# Patient Record
Sex: Male | Born: 1935 | Race: Black or African American | Hispanic: No | Marital: Married | Smoking: Former smoker
Health system: Southern US, Community
[De-identification: ages and names within clinical notes are randomized; demographics above are authoritative.]

## PROBLEM LIST (undated history)

## (undated) ENCOUNTER — Emergency Department (HOSPITAL_COMMUNITY): Discharge status: Absent without leave | Payer: Self-pay

---

## 1999-06-08 ENCOUNTER — Encounter: Admission: RE | Admit: 1999-06-08 | Discharge: 1999-09-06 | Payer: Self-pay | Admitting: Rheumatology

## 2000-04-20 ENCOUNTER — Encounter: Admission: RE | Admit: 2000-04-20 | Discharge: 2000-04-20 | Payer: Self-pay | Admitting: Rheumatology

## 2000-04-20 ENCOUNTER — Encounter: Payer: Self-pay | Admitting: Rheumatology

## 2000-11-15 ENCOUNTER — Encounter: Admission: RE | Admit: 2000-11-15 | Discharge: 2001-01-08 | Payer: Self-pay | Admitting: Internal Medicine

## 2000-12-26 ENCOUNTER — Ambulatory Visit (HOSPITAL_BASED_OUTPATIENT_CLINIC_OR_DEPARTMENT_OTHER): Admission: RE | Admit: 2000-12-26 | Discharge: 2000-12-27 | Payer: Self-pay | Admitting: Orthopedic Surgery

## 2001-04-04 ENCOUNTER — Encounter: Admission: RE | Admit: 2001-04-04 | Discharge: 2001-04-09 | Payer: Self-pay | Admitting: Internal Medicine

## 2001-07-30 ENCOUNTER — Encounter: Admission: RE | Admit: 2001-07-30 | Discharge: 2001-08-06 | Payer: Self-pay | Admitting: Orthopedic Surgery

## 2001-11-06 ENCOUNTER — Encounter (HOSPITAL_BASED_OUTPATIENT_CLINIC_OR_DEPARTMENT_OTHER): Admission: RE | Admit: 2001-11-06 | Discharge: 2001-11-12 | Payer: Self-pay | Admitting: Orthopedic Surgery

## 2002-02-18 ENCOUNTER — Encounter (HOSPITAL_BASED_OUTPATIENT_CLINIC_OR_DEPARTMENT_OTHER): Admission: RE | Admit: 2002-02-18 | Discharge: 2002-02-21 | Payer: Self-pay | Admitting: Internal Medicine

## 2002-06-03 ENCOUNTER — Encounter (HOSPITAL_BASED_OUTPATIENT_CLINIC_OR_DEPARTMENT_OTHER): Admission: RE | Admit: 2002-06-03 | Discharge: 2002-09-02 | Payer: Self-pay | Admitting: Internal Medicine

## 2002-09-05 ENCOUNTER — Encounter (HOSPITAL_BASED_OUTPATIENT_CLINIC_OR_DEPARTMENT_OTHER): Admission: RE | Admit: 2002-09-05 | Discharge: 2002-12-04 | Payer: Self-pay | Admitting: Internal Medicine

## 2002-09-12 ENCOUNTER — Ambulatory Visit (HOSPITAL_COMMUNITY): Admission: RE | Admit: 2002-09-12 | Discharge: 2002-09-12 | Payer: Self-pay | Admitting: Gastroenterology

## 2002-09-12 ENCOUNTER — Encounter (INDEPENDENT_AMBULATORY_CARE_PROVIDER_SITE_OTHER): Payer: Self-pay | Admitting: Specialist

## 2003-03-12 ENCOUNTER — Encounter (HOSPITAL_BASED_OUTPATIENT_CLINIC_OR_DEPARTMENT_OTHER): Admission: RE | Admit: 2003-03-12 | Discharge: 2003-06-10 | Payer: Self-pay | Admitting: Internal Medicine

## 2003-07-03 ENCOUNTER — Encounter (HOSPITAL_BASED_OUTPATIENT_CLINIC_OR_DEPARTMENT_OTHER): Admission: RE | Admit: 2003-07-03 | Discharge: 2003-07-15 | Payer: Self-pay | Admitting: Internal Medicine

## 2003-10-09 ENCOUNTER — Encounter (HOSPITAL_BASED_OUTPATIENT_CLINIC_OR_DEPARTMENT_OTHER): Admission: RE | Admit: 2003-10-09 | Discharge: 2003-10-16 | Payer: Self-pay | Admitting: Internal Medicine

## 2003-11-26 ENCOUNTER — Encounter: Admission: RE | Admit: 2003-11-26 | Discharge: 2003-11-26 | Payer: Self-pay | Admitting: Rheumatology

## 2004-01-21 ENCOUNTER — Encounter (HOSPITAL_BASED_OUTPATIENT_CLINIC_OR_DEPARTMENT_OTHER): Admission: RE | Admit: 2004-01-21 | Discharge: 2004-01-30 | Payer: Self-pay | Admitting: Internal Medicine

## 2004-04-10 ENCOUNTER — Encounter: Admission: RE | Admit: 2004-04-10 | Discharge: 2004-04-10 | Payer: Self-pay | Admitting: Rheumatology

## 2004-05-05 ENCOUNTER — Encounter (HOSPITAL_BASED_OUTPATIENT_CLINIC_OR_DEPARTMENT_OTHER): Admission: RE | Admit: 2004-05-05 | Discharge: 2004-05-18 | Payer: Self-pay | Admitting: Internal Medicine

## 2004-08-10 ENCOUNTER — Encounter (HOSPITAL_BASED_OUTPATIENT_CLINIC_OR_DEPARTMENT_OTHER): Admission: RE | Admit: 2004-08-10 | Discharge: 2004-08-30 | Payer: Self-pay | Admitting: Internal Medicine

## 2005-10-10 ENCOUNTER — Inpatient Hospital Stay (HOSPITAL_COMMUNITY): Admission: EM | Admit: 2005-10-10 | Discharge: 2005-10-12 | Payer: Self-pay | Admitting: Emergency Medicine

## 2005-10-11 ENCOUNTER — Encounter (INDEPENDENT_AMBULATORY_CARE_PROVIDER_SITE_OTHER): Payer: Self-pay | Admitting: *Deleted

## 2008-06-09 ENCOUNTER — Encounter: Admission: RE | Admit: 2008-06-09 | Discharge: 2008-06-09 | Payer: Self-pay | Admitting: Rheumatology

## 2008-07-14 ENCOUNTER — Encounter: Admission: RE | Admit: 2008-07-14 | Discharge: 2008-07-14 | Payer: Self-pay | Admitting: Orthopedic Surgery

## 2008-07-15 ENCOUNTER — Encounter (INDEPENDENT_AMBULATORY_CARE_PROVIDER_SITE_OTHER): Payer: Self-pay | Admitting: Orthopedic Surgery

## 2008-07-15 ENCOUNTER — Ambulatory Visit (HOSPITAL_BASED_OUTPATIENT_CLINIC_OR_DEPARTMENT_OTHER): Admission: RE | Admit: 2008-07-15 | Discharge: 2008-07-15 | Payer: Self-pay | Admitting: Orthopedic Surgery

## 2010-05-21 ENCOUNTER — Encounter: Admission: RE | Admit: 2010-05-21 | Discharge: 2010-05-21 | Payer: Self-pay | Admitting: Internal Medicine

## 2010-05-21 ENCOUNTER — Inpatient Hospital Stay (HOSPITAL_COMMUNITY): Admission: EM | Admit: 2010-05-21 | Discharge: 2010-05-27 | Payer: Self-pay | Admitting: Internal Medicine

## 2010-10-05 LAB — GLUCOSE, CAPILLARY
Glucose-Capillary: 109 mg/dL — ABNORMAL HIGH (ref 70–99)
Glucose-Capillary: 146 mg/dL — ABNORMAL HIGH (ref 70–99)
Glucose-Capillary: 149 mg/dL — ABNORMAL HIGH (ref 70–99)
Glucose-Capillary: 170 mg/dL — ABNORMAL HIGH (ref 70–99)
Glucose-Capillary: 171 mg/dL — ABNORMAL HIGH (ref 70–99)
Glucose-Capillary: 171 mg/dL — ABNORMAL HIGH (ref 70–99)
Glucose-Capillary: 182 mg/dL — ABNORMAL HIGH (ref 70–99)
Glucose-Capillary: 196 mg/dL — ABNORMAL HIGH (ref 70–99)

## 2010-10-06 LAB — BASIC METABOLIC PANEL
BUN: 7 mg/dL (ref 6–23)
BUN: 7 mg/dL (ref 6–23)
CO2: 29 mEq/L (ref 19–32)
CO2: 31 mEq/L (ref 19–32)
Chloride: 97 mEq/L (ref 96–112)
Creatinine, Ser: 0.96 mg/dL (ref 0.4–1.5)
GFR calc non Af Amer: >60 mL/min (ref >60)
GFR calc non Af Amer: >60 mL/min (ref >60)
Glucose, Bld: 176 mg/dL — ABNORMAL HIGH (ref 70–99)
Glucose, Bld: 253 mg/dL — ABNORMAL HIGH (ref 70–99)
Glucose, Bld: 263 mg/dL — ABNORMAL HIGH (ref 70–99)
Potassium: 3.9 mEq/L (ref 3.5–5.1)
Potassium: 4 mEq/L (ref 3.5–5.1)
Sodium: 133 mEq/L — ABNORMAL LOW (ref 135–145)
Sodium: 140 mEq/L (ref 135–145)

## 2010-10-06 LAB — GLUCOSE, CAPILLARY
Glucose-Capillary: 122 mg/dL — ABNORMAL HIGH (ref 70–99)
Glucose-Capillary: 185 mg/dL — ABNORMAL HIGH (ref 70–99)
Glucose-Capillary: 197 mg/dL — ABNORMAL HIGH (ref 70–99)
Glucose-Capillary: 202 mg/dL — ABNORMAL HIGH (ref 70–99)
Glucose-Capillary: 207 mg/dL — ABNORMAL HIGH (ref 70–99)
Glucose-Capillary: 218 mg/dL — ABNORMAL HIGH (ref 70–99)
Glucose-Capillary: 231 mg/dL — ABNORMAL HIGH (ref 70–99)
Glucose-Capillary: 289 mg/dL — ABNORMAL HIGH (ref 70–99)

## 2010-10-06 LAB — COMPREHENSIVE METABOLIC PANEL
ALT: 19 U/L (ref 0–53)
AST: 18 U/L (ref 0–37)
Alkaline Phosphatase: 83 U/L (ref 39–117)
CO2: 31 mEq/L (ref 19–32)
GFR calc Af Amer: >60 mL/min (ref >60)
Glucose, Bld: 195 mg/dL — ABNORMAL HIGH (ref 70–99)
Potassium: 4.4 mEq/L (ref 3.5–5.1)
Sodium: 135 mEq/L (ref 135–145)
Total Protein: 8.1 g/dL (ref 6.0–8.3)

## 2010-10-06 LAB — CBC
HCT: 44.3 % (ref 39.0–52.0)
Hemoglobin: 14.5 g/dL (ref 13.0–17.0)
MCHC: 31.6 g/dL (ref 30.0–36.0)
MCV: 80.6 fL (ref 78.0–100.0)
Platelets: 279 10*3/uL (ref 150–400)
RDW: 15.1 % (ref 11.5–15.5)
RDW: 15.3 % (ref 11.5–15.5)
WBC: 12 10*3/uL — ABNORMAL HIGH (ref 4.0–10.5)
WBC: 9.1 10*3/uL (ref 4.0–10.5)

## 2010-10-06 LAB — PHOSPHORUS: Phosphorus: 1.9 mg/dL — ABNORMAL LOW (ref 2.3–4.6)

## 2010-12-07 NOTE — Op Note (Signed)
NAMEENRRIQUE, MIERZWA               ACCOUNT NO.:  [id number]   MEDICAL RECORD NO.:  [id number]          PATIENT TYPE:  AMB   LOCATION:  DSC                          FACILITY:  [facility]   PHYSICIAN:  Katy Fitch. Sypher, M.D. 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   OPERATION:  1. Examination of right shoulder under anesthesia documenting 60      degrees flexion contracture of the right elbow and basic stability      to radial and ulnar testing at 60 degrees flexion and 90 degrees      flexion.  2. Decompression of right ulnar nerve and exploration of ulnar      collateral ligament structures of medial aspect of right elbow.  3. Anterior, lateral, and anterior-posterior synovectomy of right      elbow with extensive capsulectomy and decompression of posterior      interosseous nerve via synovectomy with decision to not to resect      radial head out of concern that it could lead to valgus instability      of elbow.  4. Olecranon osteoplasty with removal of superior olecranon osteophyte      and marginal osteophytes.   OPERATING SURGEON:  Katy Fitch. Sypher, MD   ASSISTANT:  None.   ANESTHESIA:  General by LMA.   SUPERVISING ANESTHESIOLOGIST:  Quita Skye. Krista Blue, MD   INDICATIONS:  Baylor Cortez is a 77 year old  gentleman well acquainted  with our practice.  He was referred by Dr. Lennox Pippins for evaluation  and management of complex arthritis deformities and synovitis issues.  Mr. Birdwell is intolerant to nonsteroidal medications and has been  maintained on prednisone.   Recently, he was referred by Dr. Coral Spikes due to profound swelling of his  right elbow pain and loss of motion.  Examination of the right elbow  revealed that he had profound synovitis and possible ulnar and posterior  interosseous nerve compression.  It was difficult to judge if his distal  weakness was due to his advanced arthritis or nerve compression.   He had screening electrodiagnostic studies by Dr. Johna Roles, which  revealed some slowing of the ulnar nerve conduction, but no signs of  frank impairment.  His posterior interosseous motors appeared to be  functional.   Plain x-rays of the elbow demonstrated erosive arthropathy due to poorly-  controlled rheumatoid arthritis.  He had bone-on-bone arthropathy at the  radiocapitellar articulation and a very large proximal olecranon  osteophyte that may have been impinging and causing a portion  of his  failure to extend the elbow.   We advised Mr. Kau to undergo synovectomy of the elbow anticipating  possible radial head resection, pre-synovectomy, decompression of the  ulnar nerve for safe retraction of the nerve and decompression to  relieve his ulnar dysesthesias and anticipation of aggressive  synovectomy with possible olecranon osteophyte resection to facilitate  recovery of elbow extension.   After informed consent, Mr. Harte was brought to the operating room at  this time.  Preoperatively, he was interviewed by Dr. Krista Blue and advised  to undergo general anesthesia by endotracheal technique.   PROCEDURE:  Yadiel Aubry. Lybarger was brought to the operating room and  placed in supine position upon the operating table.   Following the induction of general anesthesia  by LMA technique,  supplemental Decadron was provided for steroid support.  Ancef 1 g was  administered as an IV prophylactic antibiotic beginning in the holding  area followed by routine Betadine scrub and paint of the right upper  extremity.  A pneumatic tourniquet was applied to the proximal right  brachium.   Following exsanguination of the right arm with an Esmarch bandage, an  arterial tourniquet was inflated to 240 mmHg and later elevated to 270  mmHg due to systolic hypertension.   The procedure commenced with a curvilinear incision paralleling the path  of the ulnar nerve.  Subcutaneous tissues were carefully divided taking  care to identify the ulnar nerve at the proximal aspect of the medial  epicondyle.  A decompression of the ulnar nerve in situ was accomplished  followed by placement of a quarter-inch Penrose drain for medial and  superior traction.  There was noted to be some synovitis along the  medial gutter of the elbow; however, the ulnar collateral ligament  structures were intact.   Attention was then directed to the lateral aspect of the elbow were a  lengthy incision paralleling the crest of the lateral epicondyle was  continued distally over the radial head and neck.  Care was taken to  identify and protect the lateral ulnar collateral ligament followed by  elevation of the extensor carpi radialis brevis and longus muscle  origins, and release of the brachial radialis and anterior superior  capsule.  The joint was entered and blunt retractors placed to expose  the radial neck and the anterior capsule.  There were profound  loculations within the elbow joint and approximately 150 mL of hyperemic  synovitis.  A meticulous synovectomy was accomplished of the proximal  radioulnar joint, the elbow joint including the humeral ulnar and radial  capitellar joints followed by release of some of the capsular loculation  and stripping of the anterior capsule off of the  distal humerus in an  effort to relieve the flexion contracture.  Care was taken not to  violate the brachial radialis muscle nor the capsule adjacent to the  radial neck for fear of causing injury to the posterior interosseous  nerve branches.  After complete synovectomy, hemostasis was achieved  with bipolar cautery under saline and suctioned.  We then created a  posterior arthrotomy preserving the lateral ulnar collateral ligament  and radial collateral ligaments, and performed a meticulous synovectomy  of the posterior humeral ulnar articulation.  There was a large proximal  osteophyte on the olecranon that was resected with removal of more than  12 mm of bone.  The osteophytes along the margin of the humeral ulnar  articulation were removed with rongeurs and osteotome dissection in an  effort to try to improve extension.  Ultimately, extension of the elbow  to about 20 degrees was achieved under anesthesia.   After meticulous hemostasis, we carefully examined the position of the  ulnar nerve and performed a medial posterior synovectomy with the nerve  retracted out of the cubital groove.   After hemostasis was achieved, the wound was then repaired with  reconstruction of the extensor origin of the extensor carpi radialis  longus and brevis with multiple mattress sutures of 3-0 FiberWire  followed by repair of the capsule and fascia with figure-of-eight  sutures of 3-0 Ethibond.  The skin was repaired with subcutaneous  sutures of 4-0 Vicryl and intradermal 3-0 Prolene, repairing both the  medial and lateral wounds.   A 7-French Hemovac drain was placed in the anterior joint to prevent  collection of a postoperative hematoma and tense effusion.  The wound  was infiltrated with 2% lidocaine for postoperative analgesia prior to  placing the drain at suction.  There were no apparent complications.  Estimated blood loss was less than 50 mL.  Approximately, 150 mL of  synovium was  harvested and representative specimens sent to pathology  for examination.   The tourniquet was released with total tourniquet time of 1 hour 43  minutes.  Mr. Stavely was awakened from anesthesia, placed in sling and  transferred to recovery room in stable vital signs in a soft dressing.  He will return to our office for followup in 24 hours for drain removal.      Katy Fitch. Sypher, M.D.  Electronically Signed     RVS/MEDQ  D:  07/15/2008  T:  07/16/2008  Job:  [id number]   cc:   Demetria Pore. Coral Spikes, M.D.

## 2010-12-10 NOTE — Consult Note (Signed)
NAMEKENSON, Walter Benson NO.:  [id number]   MEDICAL RECORD NO.:  [id number]          PATIENT TYPE:  OBV   LOCATION:  5524                         FACILITY:  [facility]   PHYSICIAN:  Petra Kuba, M.D.    DATE OF BIRTH:  1933/01/14   DATE OF CONSULTATION:  10/11/2005  DATE OF DISCHARGE:                                   CONSULTATION   REFERRING PHYSICIAN:  Dr. Theressa Millard.   HISTORY:  The patient is seen at the request of Dr. Benjaman Kindler for melena,  anemia and guaiac-positive.  He has been on an aspirin a day and does have a  history of ulcers in the '80s, but no other GI problem.  He supposedly had  an uneventful colonoscopy by Dr. Laural Benes in 2004.   PAST MEDICAL HISTORY:  1.  Diabetes.  2.  Arthritis.   PAST SURGICAL HISTORY:  1.  Cholecystectomy.  2.  Hand surgery.   ALLERGIES:  None, although he lists ASPIRIN.   MEDICINES AT HOME:  Medicines at home include prednisone and methotrexate,  had been on baby aspirin, Metformin, Centrum Silver, Fosamax and Cytotec.   FAMILY HISTORY:  Family history is negative for any obvious GI problems.   SOCIAL HISTORY:  He does drink socially, does smoke.   REVIEW OF SYSTEMS:  Negative, except above.  He is feeling better.   PHYSICAL EXAMINATION:  VITALS:  See chart.  LUNGS:  Clear.  CARDIAC:  Regular rate and rhythm.  ABDOMEN:  Soft and nontender.   LABORATORY DATA:  No repeat labs done since yesterday.  See chart for  details.   ASSESSMENT:  1.  Major medical problems.  2.  Melena in a patient with history of ulcers years ago, on a baby aspirin      a day.   PLAN:  The risks, benefits and methods of endoscopy were discussed; we  compared it to the colonoscopy he has had.  We will proceed this morning  with further workup and plans pending those findings.           ______________________________  Petra Kuba, M.D.     MEM/MEDQ  D:  10/11/2005  T:  10/12/2005  Job:  [id number]   cc:   Theressa Millard, M.D.  Fax: [phone]

## 2010-12-10 NOTE — Discharge Summary (Signed)
NAMETAYRON, Walter Benson NO.:  [id number]   MEDICAL RECORD NO.:  [id number]          PATIENT TYPE:  INP   LOCATION:  5524                         FACILITY:  [facility]   PHYSICIAN:  Theressa Millard, M.D.    DATE OF BIRTH:  03/27/1933   DATE OF ADMISSION:  10/10/2005  DATE OF DISCHARGE:  10/12/2005                                 DISCHARGE SUMMARY   ADMISSION DIAGNOSIS:  Gastrointestinal bleed.   DISCHARGE DIAGNOSES:  1.  Gastrointestinal bleed secondary to antral erosions secondary to      Helicobacter pylori.  2.  Acute blood loss anemia.  3.  Diabetes mellitus.  4.  Rheumatoid arthritis.   The patient is a 77 year old black male who was admitted with two melenic  stools.  At admission his hemoglobin was 8.6.   HOSPITAL COURSE:  The patient was admitted and he was transfused two units  of packed cells.  Subsequent hemoglobin came back 8.1 and after that 7.8.  He had no further melena in the hospital.  He was seen in consultation by  Dr. Ewing Schlein and underwent endoscopy with findings of antral erosions.  CLOtest  was done and is positive.   He did well after the procedure, and he is discharged in improved condition.   DISCHARGE MEDICATIONS:  1.  Clarithromycin 500 mg b.i.d. and amoxicillin 1 g b.i.d. x14 days, with      Prevacid SoluTab 30 mg daily.  2.  Glucotrol XL 10 mg daily.  3.  Methotrexate 10 mg weekly.  4.  Prednisone 5 mg daily.  5.  Folic acid 1 mg daily.  6.  Metformin 500 mg b.i.d.   FOLLOW-UP:  He has an appointment to see Dr. Coral Spikes in one week.   DISCHARGE ACTIVITY:  As tolerated.   DISCHARGE DIET:  Modified carbohydrate diabetic diet.      Theressa Millard, M.D.  Electronically Signed     JO/MEDQ  D:  10/12/2005  T:  10/13/2005  Job:  [id number]   cc:   Petra Kuba, M.D.  Fax: [phone]

## 2010-12-10 NOTE — H&P (Signed)
NAMEJOHNATTAN, STRASSMAN NO.:  [id number]   MEDICAL RECORD NO.:  [id number]          PATIENT TYPE:  OBV   LOCATION:  5524                         FACILITY:  [facility]   PHYSICIAN:  Theressa Millard, M.D.    DATE OF BIRTH:  05/02/1933   DATE OF ADMISSION:  10/10/2005  DATE OF DISCHARGE:                                HISTORY & PHYSICAL   Walter Benson is a 77 year old black male who was admitted with GI bleed.   He has a history of peptic ulcer disease in the 1980s.  It healed with  medications and was thought to be due to taking Indocin.  He felt some  stomach gurgling without any discomfort on the evening of March 17.  He took  some Mylanta.  On the morning of March 18 he had black stools on two  occasions that were semi-formed and thick.  They were tar-like.  He has had  no bowel movements today and no further black stools.  The gurgling has gone  away.  He took his usual medications today, but has not taken his Fosamax.  He feels a little bit weak, but has had no dizziness.  He had a colonoscopy  in 2004.  He occasionally consumes alcohol.  He does not take Aleve or  ibuprofen but he does take enteric-coated aspirin.   PAST MEDICAL HISTORY:   MEDICATIONS:  1.  Glucotrol XL 10 mg daily.  2.  Methotrexate 10 mg weekly.  3.  Prednisone 5 mg daily.  4.  Folic acid 1 mg daily.  5.  Ecotrin 81 mg daily.  6.  Multivitamin daily.  7.  Fosamax weekly.   MEDICAL:  1.  Seropositive erosive nodular rheumatoid arthritis.  He has extra-      articular manifestations including episcleritis.  2.  Mild anemia attributed to rheumatoid arthritis.  3.  Diabetes mellitus.  No complications of diabetes known.  4.  Hematuria in 1985 with negative workup.  5.  Lactose intolerance.   SURGICAL:  1.  Cholecystectomy.  2.  Tonsillectomy.  3.  Circumcision.  4.  Nasal passage surgery.   SOCIAL HISTORY:  Married.  Stopped smoking over 20 years ago.  Retired from  the school system.   FAMILY HISTORY:  Diabetes, cancer of unknown type, heart disease after age  84.   REVIEW OF SYSTEMS:  Otherwise negative.   PHYSICAL EXAMINATION:  GENERAL:  Well-developed, well-nourished, in no  distress.  VITAL SIGNS:  Blood pressure 118/70, pulse 120 and regular sitting.  HEENT:  The eyes have pupils which are round, react to light.  The  extraocular movements are intact.  Funduscopic examination is normal.  Ears  are normal.  Nose and throat unremarkable.  NECK:  Supple.  Thyroid is not enlarged or tender.  CHEST:  Clear to auscultation, percussion.  CARDIAC:  Normal S1 and S2 without an S3, S4, murmur, rub, or click.  ABDOMEN:  Soft and nontender with normal bowel sounds.  Stool is black and  heme-positive.   LABORATORY DATA:  Pending.   IMPRESSION:  Gastrointestinal bleeding.  He has  some bleeding and his pulse  is up somewhat.  I will bring him in for observation and possible EGD  tonight or tomorrow.  Will check on laboratory work to decide on that.  Start a proton-pump inhibitor.  Hold Ecotrin and Fosamax for now.      Theressa Millard, M.D.  Electronically Signed     JO/MEDQ  D:  10/10/2005  T:  10/11/2005  Job:  [id number]

## 2010-12-10 NOTE — Op Note (Signed)
   NAME:  Walter Benson, Walter Benson                         ACCOUNT NO.:  [id number]   MEDICAL RECORD NO.:  [id number]                   PATIENT TYPE:  AMB   LOCATION:  ENDO                                 FACILITY:  [facility]   PHYSICIAN:  Danise Edge, M.D.                DATE OF BIRTH:  Mar 17, 1933   DATE OF PROCEDURE:  09/12/2002  DATE OF DISCHARGE:                                 OPERATIVE REPORT   PROCEDURE PERFORMED:  Colonoscopy and sigmoid polypectomy.   REFERRING PHYSICIAN:  Demetria Pore. Coral Spikes, M.D.   ENDOSCOPIST:  Charolett Bumpers, M.D.   INDICATIONS FOR PROCEDURE:  The patient is a 77 year old male born November 03, 1932.  Walter Benson is scheduled to undergo his first screening colonoscopy  with polypectomy to prevent colon cancer.   PREMEDICATION:  Demerol 50 mg, Versed 5 mg.   INSTRUMENT USED:  Adult Olympus colonoscope.   DESCRIPTION OF PROCEDURE:  After obtaining informed consent, the patient was  placed in the left lateral decubitus position.  I administered intravenous  Demerol and intravenous Versed to achieve conscious sedation for the  procedure.  The patient's blood pressure, oxygen saturations and cardiac  rhythm were monitored throughout the procedure and documented in the medical  record.   Anal inspection was normal.  Digital rectal exam was normal.  The Olympus  colonoscope was introduced into the rectum and advanced to the cecum.  Colonic preparation for the exam today was satisfactory.   Rectum:  Normal.   Sigmoid colon and descending colon:  Left colonic diverticulosis.  At 35 cm  from the anal verge, a 2 mm sessile polyp was removed with the  electrocautery snare.   Splenic flexure:  Normal.   Transverse colon:  Normal.   Hepatic flexure:  Normal.   Ascending colon:  Normal.   Cecum and ileocecal valve:  Normal.    ASSESSMENT:  1. From the sigmoid colon at 35 cm from the anal verge, a 2 mm sessile polyp     was removed.  2. Left colonic  diverticulosis.                                                 Danise Edge, M.D.    MJ/MEDQ  D:  09/12/2002  T:  09/12/2002  Job:  [id number]   cc:   Demetria Pore. Coral Spikes, M.D.  [address]  [address]  [place]  [place] [address]  Fax: [phone]

## 2010-12-10 NOTE — Op Note (Signed)
[place]  Patient:    Walter Benson, Walter Benson                      MRN: [id number] Proc. Date: 12/26/00 Adm. Date:  [id number] Attending:  Sypher, Douglass Rivers CC:         Demetria Pore. Coral Spikes, M.D.   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  OPERATION PERFORMED: 1. Arthrodesis of left wrist with autogenous left ulnar and radial bone graft    with ASIF 7-hole dorsal plate. 2. Distal ulnar resection left wrist with synovectomy of the distal radioulnar    joint and reconstruction of capsule. 3. Incidental synovectomy of wrist and carpometacarpal joints of index and    long finger, left hand.  SURGEON:  Katy Fitch. Sypher, Montez Hageman., M.D.  ASSISTANT:  Jonni Sanger, P.A.  ANESTHESIA:  General orotracheal.  SUPERVISING ANESTHESIOLOGIST:  Dr. Gelene Mink.  INDICATIONS FOR PROCEDURE:  The patient is a 77 year old gentleman who has had chronic rheumatoid arthritis.  He was followed by Dr. Lennox Pippins from a rheumatology management standpoint.  Dr. Coral Spikes and Dr. Thurston Hole referred him for an upper extremity orthopedic consult due to chronic left wrist pain.  I had seen him approximately four years prior and had advised that he consider wrist arthrodesis when his pain became intolerable.  Recent x-rays revealed bone-on-bone arthropathy at the radiocarpal articulation and signs of chronic ulnocarpal abutment and severe arthritis of the distal radioulnar joint.  After informed consent, he was brought to the operating room at this time anticipating arthrodesis of the left wrist with possible iliac bone graft versus distal ulnar and  radial bone graft with anticipated resection arthroplasty of the distal radioulnar joint.  DESCRIPTION OF PROCEDURE:  Raiquan Chandler was brought to the operating room and placed in supine position on the operating table.  Following induction of general orotracheal anesthesia, the left arm was prepped with Betadine soap and solution and sterilely draped.  The left anterior iliac crest region was prepped with DuraPrep and draped with impervious arthroscopy drapes.  1 gm of Ancef was administered as an IV prophylactic antibiotic and 100 mg of Solu-Cortef was administered for steroid support in the perioperative  period.  Following exsanguination of the limb with an Esmarch bandage, the arterial tourniquet was inflated to 220 mmHg and later elevated to 270 mmHg due to labile hypertension.  The procedure commenced with a 15 cm longitudinal incision extending from the diaphysis of the long finger metacarpal to the distal radial metaphysis.  The subcutaneous tissues were carefully divided taking care to electrocauterize the perforating veins.  The third dorsal compartment was entered and the extensor pollicis longus retracted in a radial direction.  The periosteum overlying the dorsal radius was elevated over the second, third and fourth dorsal compartments.  The wrist capsule was incised longitudinally and a complete synovectomy of the radiocarpal articular, ulnocarpal articulation, midcarpal articulation and the carpometacarpal joints was accomplished.  The remaining portions of hyaline articular cartilage on the distal radius, scaphoid, lunate and triquetrum were removed with curet and osteotome dissection.  A rongeur was used to clear debris.  The distal radius was then feathered with an osteotome and the contour of the dorsal distal  radius was altered by removal of Listers tubercle as a bone graft with a rongeur followed by use of an osteotome to take corticocancellous shavings to a  total volume of approximately 15 cc.  This leveled the contour of the distal radius and allowed application of a semitubular ASIF 7-hole plate.  The plate was contoured to accommodate position of wrist dorsiflexion of 20 degrees and slight ulnar deviation.  The soft tissues overlying the long finger metacarpal were elevated and the base of the metacarpal was reshaped with an osteotome also harvesting autogenous graft.  The plate was contoured and a second 4-hole plate was used to stack to give maximum strength across the carpus.  The distal ulna was exposed to a transverse incision in the extensor retinaculum directly between the fifth and sixth dorsal compartments.  The capsule of the distal radioulnar joint was entered with electrocautery followed by use of Therapist, nutritional and a Kleinert elevator to expose the distal ulna protecting the triangular fibrocartilage and capsule.  The distal ulna was resected with oscillating saw followed by use rongeur to trim the margins of the distal ulna and complete a synovectomy of the distal radioulnar joint.  The triangular fibrocartilage was significantly degenerative with a large central perforation.  The dorsal and palmar radioulnar ligaments were preserved.  The capsule was imbricated with mattress sutures of 3-0 Ethibond and the retinaculum repaired with 3-0 Ethibond.  The wrist joint was then filled with corticocancellous shavings from the distal radius, third metacarpal base and the ulnar head.  They were cleared of all soft tissues and hyaline cartilage.  The plate was then applied with the usual ASIF technique, applying compression by eccentric placement of screws proximally and distally.  A very satisfactory position of the wrist was achieved in approximately 20 degrees dorsiflexion and slight ulnar deviation.  The C-arm fluoroscopy was used to confirm excellent plate contour and satisfactory placement of cortical and  cancellous screws.  The wound was then thoroughly lavaged with sterile saline followed by repair  of the capsule with mattress sutures of 3-0 Ethibond and repair of the retinaculum with 3-0 Ethibond.  The skin was repaired with subdermal sutures of 3-0 Vicryl and intradermal 3-0 Prolene with Steri-Strips and vessel loop drains to prevent collection of postoperative hematoma.  Mr. Coury tolerated the surgery and anesthesia well and was transferred to the recovery room with stable vital signs.  Note that he will be observed for 24 hours in the [facility] with observation of his vital signs and analgesics in the form of PCA morphine and IV Dilaudid as well as Ancef 1 gm IV q.8h. as a prophylactic antibiotic. DD:  12/26/00 TD:  12/26/00 Job: [id number]

## 2010-12-10 NOTE — Consult Note (Signed)
[facility]  Patient:    Walter Benson, Walter Benson                     MRN: [id number] Proc. Date: 11/15/00 Attending:  Jonelle Sports. Cheryll Cockayne, M.D. CC:         Elana Alm. Thurston Hole, M.D.   Consultation Report  HISTORY:  This 77 year old black male with known rheumatoid arthritis is referred for consideration of appropriate footwear and inserts.  He apparently has had some progressive deformity in recent years related to his rheumatoid arthritis with overlapping of the toes, particularly on the left foot, and with some hallux valgus formations bilaterally and general fibular drift to the forefoot.  He reports that he had a great deal of pain in his left foot, particularly in the midfoot, and has been wearing a self-prescribed soft orthotic ankle appliance there.  He apparently has never had ulceration of his feet and as far as he knows, his circulation is intact.  PAST MEDICAL HISTORY:  As previously mentioned, he has rheumatoid arthritis, under the treatment of Dr. Demetria Pore. Levitin.  He has type 2 diabetes which has been present for several years.  MEDICATIONS:  He takes both methotrexate and prednisone along with Glucotrol XL and metformin for his diabetes.  He is on a baby aspirin per day.  PAST SURGICAL HISTORY:  His only prior surgeries have been cholecystectomy and nasal surgery.  ALLERGIES:  He has no known medicinal allergies.  PHYSICAL EXAMINATION:  EXTREMITIES:  Examination today is limited to the distal lower extremities. The patients feet are warm and well-perfused.  Skin temperatures are essentially normal and symmetrical throughout.  There are minimal callus formations on the dorsal interphalangeal joints of the fourth and fifth toes on the right.  There is no significant edema.  All pulses are palpable and quite adequate.  Monofilament testing shows the presence of protective sensation throughout both feet.  As previously indicated, there  are deformities with considerable hallux valgus formations bilaterally and with overlapping of the second and third toes on the left foot.  There is some metatarsalgia in that foot to palpation.  DISPOSITION: 1. The patient is given instruction regarding foot care by video presentation    with nurse reinforcement. 2. This clinic concurs in the patients need for proper footwear and he is    accordingly seen in consultation with Mr. Alison Stalling of Probation officer.    Plans will be made to provide the patient with shoes of extra depth and    width, fitted with adequate soft custom-molded inserts in hopes of    minimizing his discomfort and heading off future problems. 3. Return visit to this clinic will be in six weeks after he has obtained his    shoes. DD:  11/15/00 TD:  11/16/00 Job: [id number]

## 2010-12-10 NOTE — Op Note (Signed)
NAMEELDREDGE, Walter Benson NO.:  [id number]   MEDICAL RECORD NO.:  [id number]          PATIENT TYPE:  OBV   LOCATION:  5524                         FACILITY:  [facility]   PHYSICIAN:  Petra Kuba, M.D.    DATE OF BIRTH:  03-17-1933   DATE OF PROCEDURE:  10/11/2005  DATE OF DISCHARGE:                                 OPERATIVE REPORT   PROCEDURE PERFORMED:  Esophagogastroduodenoscopy with biopsy.   ENDOSCOPIST:  Petra Kuba, M.D.   INDICATIONS FOR PROCEDURE:  Melena, anemia, patient on baby aspirin.  History of ulcers in past.  Consent was signed after the risks, benefits,  methods and options were thoroughly discussed prior to any premeds given.   MEDICINES USED:  Demerol 40 mg, Versed 5 mg.   DESCRIPTION OF PROCEDURE:  The video endoscope was inserted by direct  vision.  He did have a tiny hiatal hernia.  Scope passed into the stomach  and some flecks of old blood were seen but no signs of active bleeding.  The  scope was advanced through a normal antrum, normal pylorus into the duodenal  bulb where two tiny ulcers or superficial erosions were seen.  There was no  at-risk lesions.  The area was washed, could not be made to bleed.  The  scope was advanced around the C-loop to a normal second portion of the  duodenum.  No blood was seen distally.  The scope was withdrawn back to the  bulb and a good look there confirmed the above findings.  Again, these areas  were washed and watched and could not be made to bleed.  Scope was withdrawn  back to the stomach where some of the flecks of blood were washed and  suctioned.  On straight and retroflex visualization, including a good look  at the cardia, fundus and angularis, lesser and greater curve.  No  abnormalities were seen.  Two biopsies of the antrum for the CLO test to  rule out Helicobacter pylori and then two of the antrum and two of the  proximal stomach were obtained and put in pathology to rule out  Helicobacter. Air was suctioned as was fluid suctioned, scope was slowly  withdrawn.  Again, a good look at the esophagus was normal except for the  tiny hiatal hernia.  Scope was removed.  The patient tolerated the procedure  well.  There were no obvious immediate complication.   ENDOSCOPIC DIAGNOSES:  1.  Tiny hiatal hernia.  2.  Minimal flecks of old blood in the stomach.  3.  Two superficial tiny ridges/ulcers in the bulb without any at risk      stigmata.  4.  Otherwise normal esophagogastroduodenoscopy to second portion of the      duodenum, status post CLO biopsy and biopsy of the stomach to rule out      Helicobacter pylori.   PLAN:  Hold aspirin.  Pump inhibitors for two months. If he has to go back  to aspirin or nonsteroidals, use pump inhibitors as prevention.  Await  pathology.  If positive treat Helicobacter.  Happy to  see back p.r.n.  Okay  to advance diet and probably go home soon if no signs of further bleeding or  delayed complications.           ______________________________  Petra Kuba, M.D.     MEM/MEDQ  D:  10/11/2005  T:  10/12/2005  Job:  [id number]   cc:   Demetria Pore. Coral Spikes, M.D.  Fax: [phone]   Theressa Millard, M.D.  Fax: [phone]

## 2011-04-28 LAB — BASIC METABOLIC PANEL
BUN: 10 mg/dL (ref 6–23)
Chloride: 105 mEq/L (ref 96–112)
GFR calc Af Amer: >60 mL/min (ref >60)
GFR calc non Af Amer: >60 mL/min (ref >60)
Potassium: 4.1 mEq/L (ref 3.5–5.1)
Sodium: 139 mEq/L (ref 135–145)

## 2011-04-28 LAB — POCT HEMOGLOBIN-HEMACUE: Hemoglobin: 11.1 g/dL — ABNORMAL LOW (ref 13.0–17.0)

## 2011-04-28 LAB — GLUCOSE, CAPILLARY
Glucose-Capillary: 138 mg/dL — ABNORMAL HIGH (ref 70–99)
Glucose-Capillary: 181 mg/dL — ABNORMAL HIGH (ref 70–99)

## 2011-09-24 IMAGING — CR DG ABDOMEN 2V
2 series · 2 of 2 positions shown · non-contrast
Comparison: 05/24/2010

CLINICAL DATA: Small bowel obstruction

ABDOMEN - 2 VIEW

[w abdomen upright]
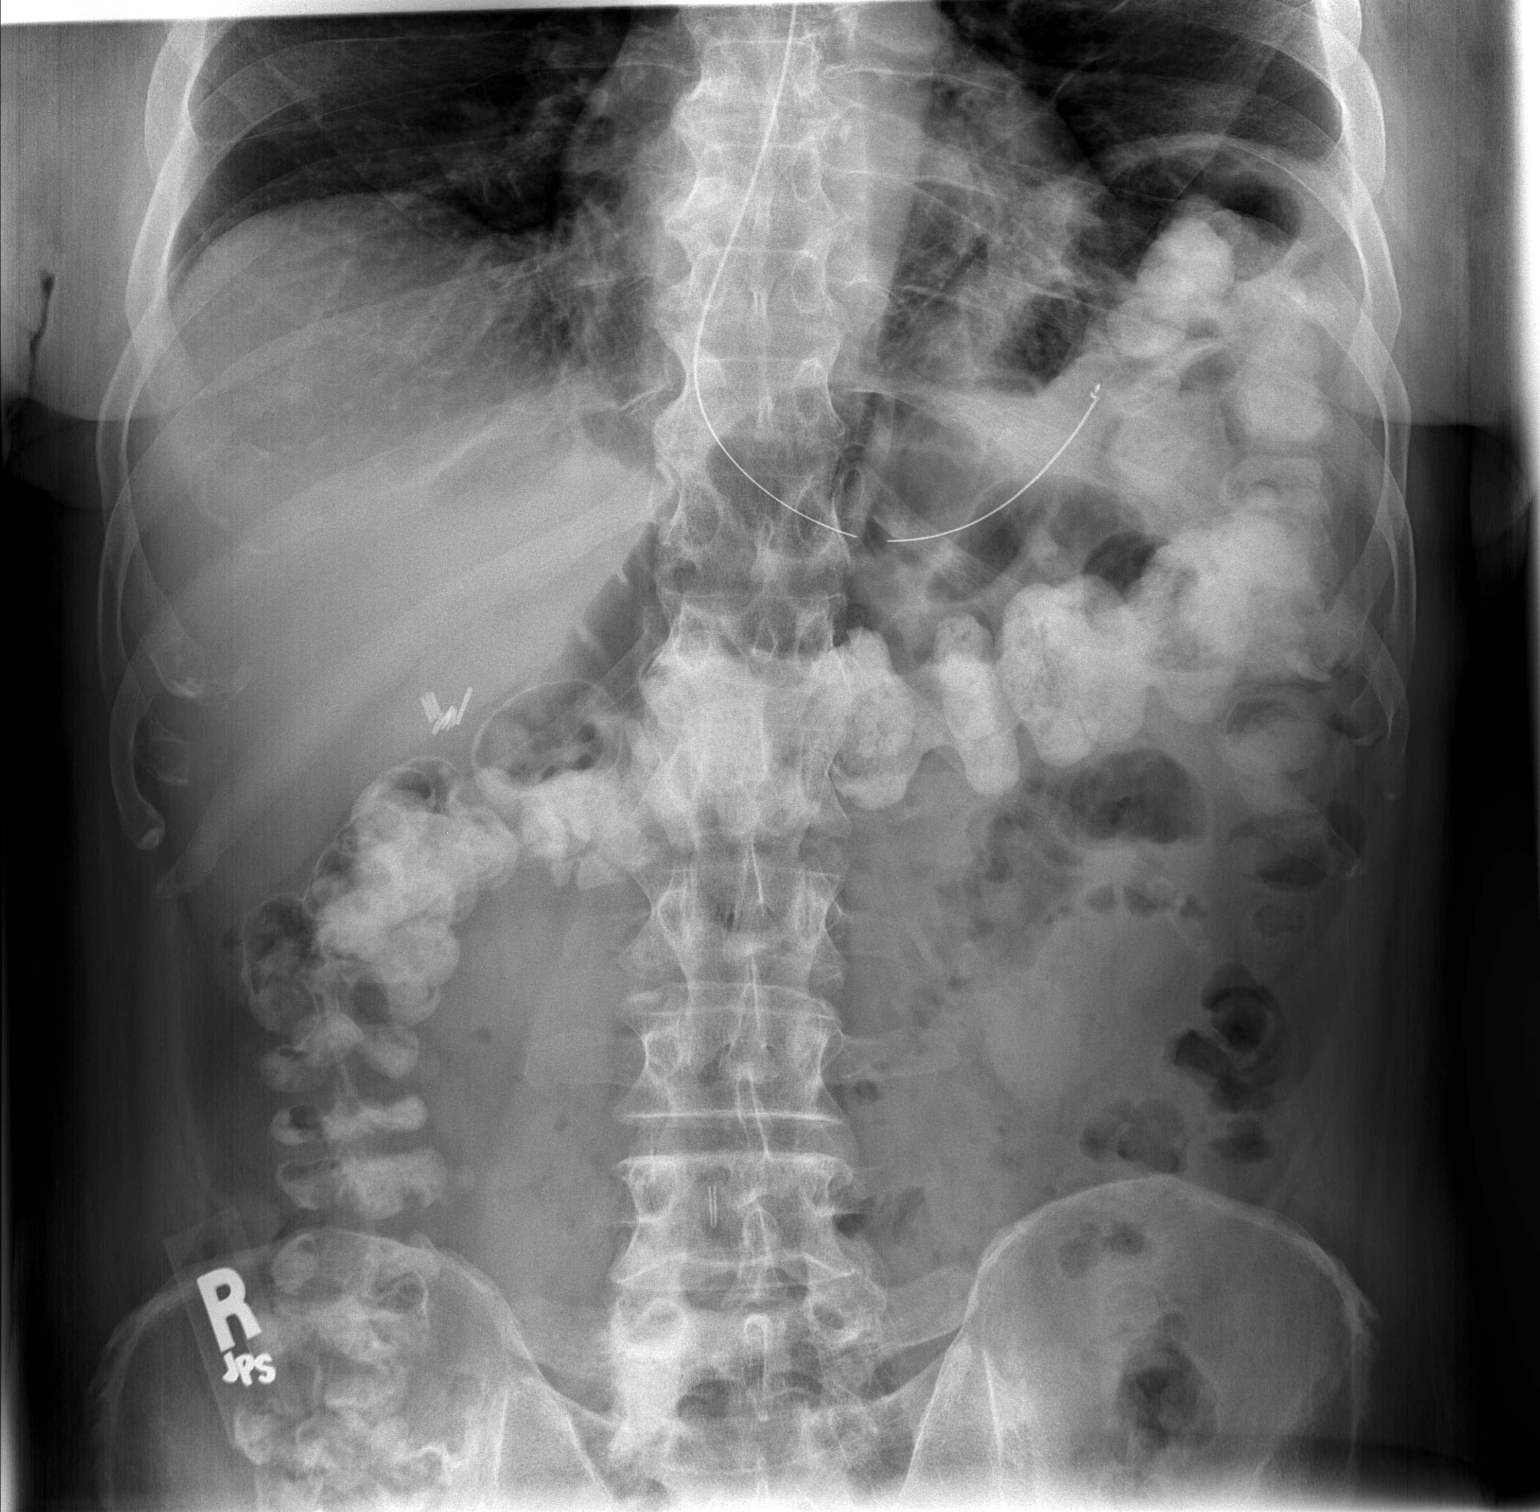

[t abdomen supine]
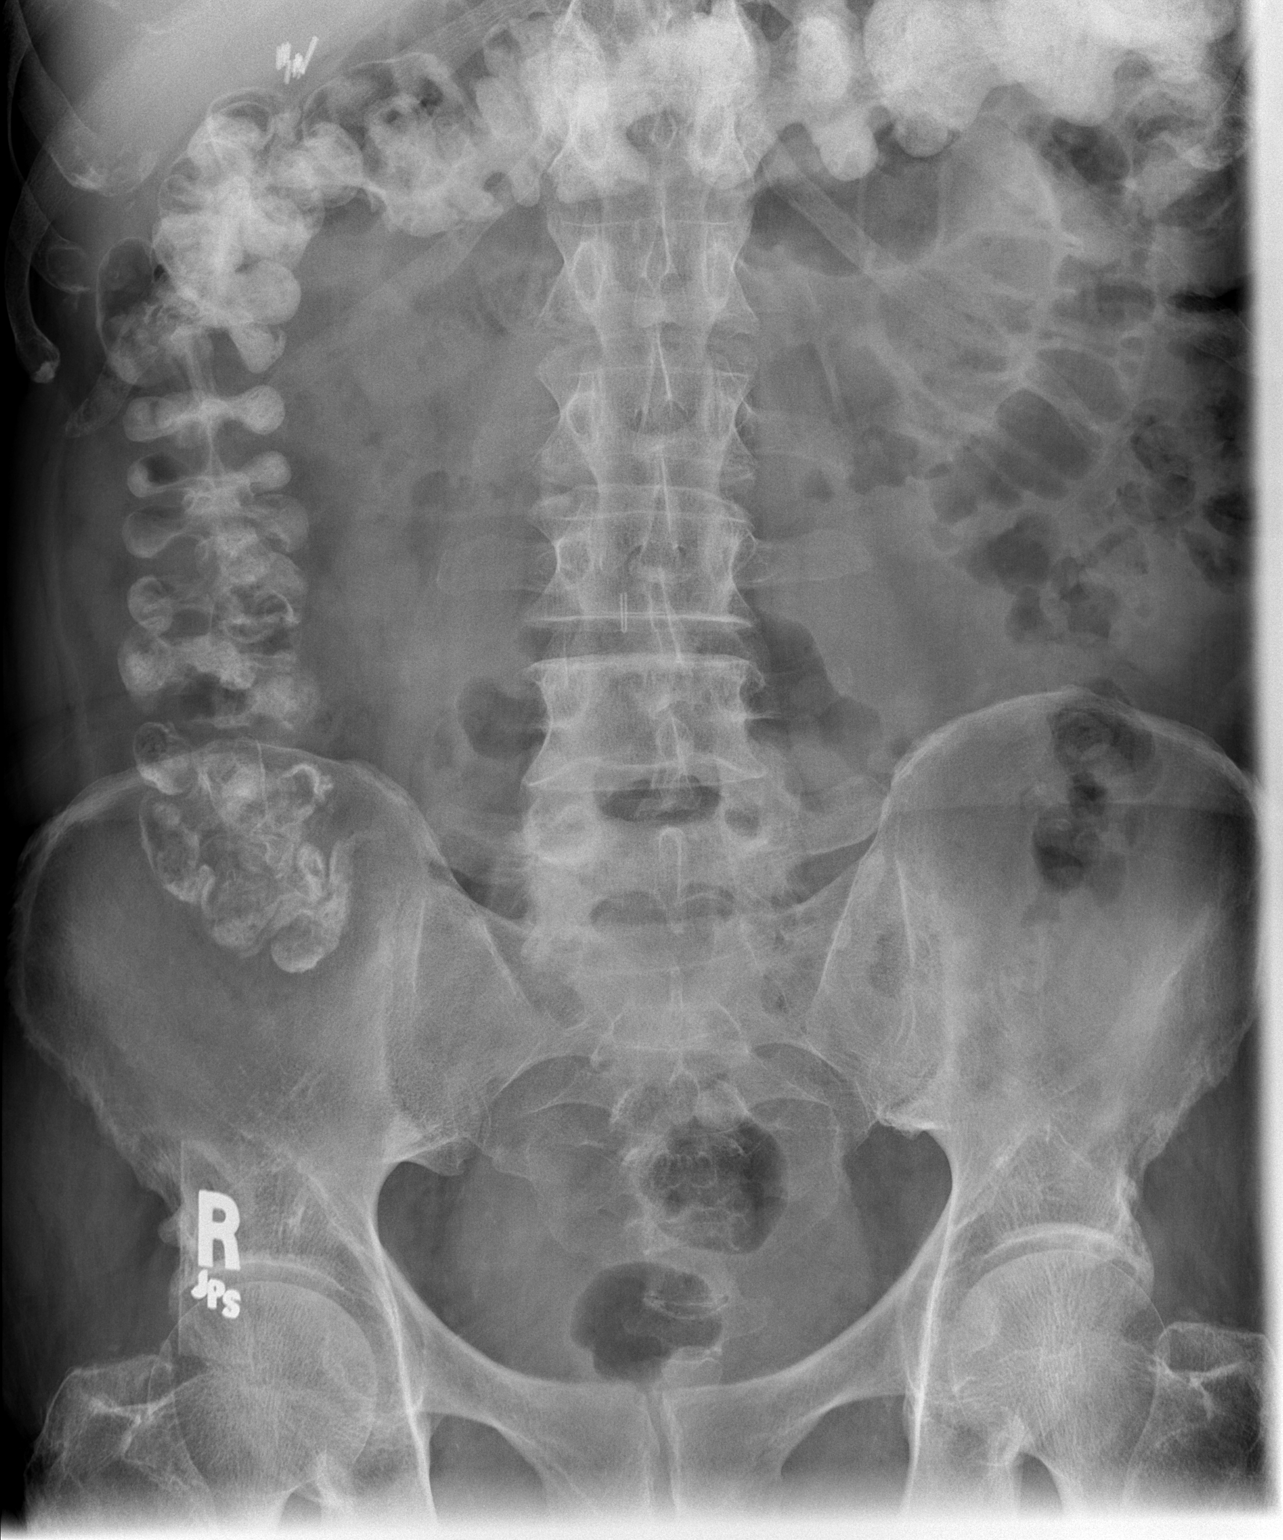

[2 of 2 positions shown; findings below may reference images not displayed]

FINDINGS: NG tube remains in the stomach.  Cholecystectomy clips
noted.  Retained contrast again noted in the colon.  Dilated loops
of small bowel remain in the epigastric region on the upright exam.
No free air.  Overall stable exam.
IMPRESSION: Stable bowel gas pattern suspicious for residual partial small
bowel obstruction.

## 2011-09-25 IMAGING — CR DG ABDOMEN 2V
3 series · 3 of 3 positions shown · non-contrast
Comparison: 05/25/2010

CLINICAL DATA: A small bowel obstruction.

ABDOMEN - 2 VIEW

[w abdomen upright (1 of 2)]
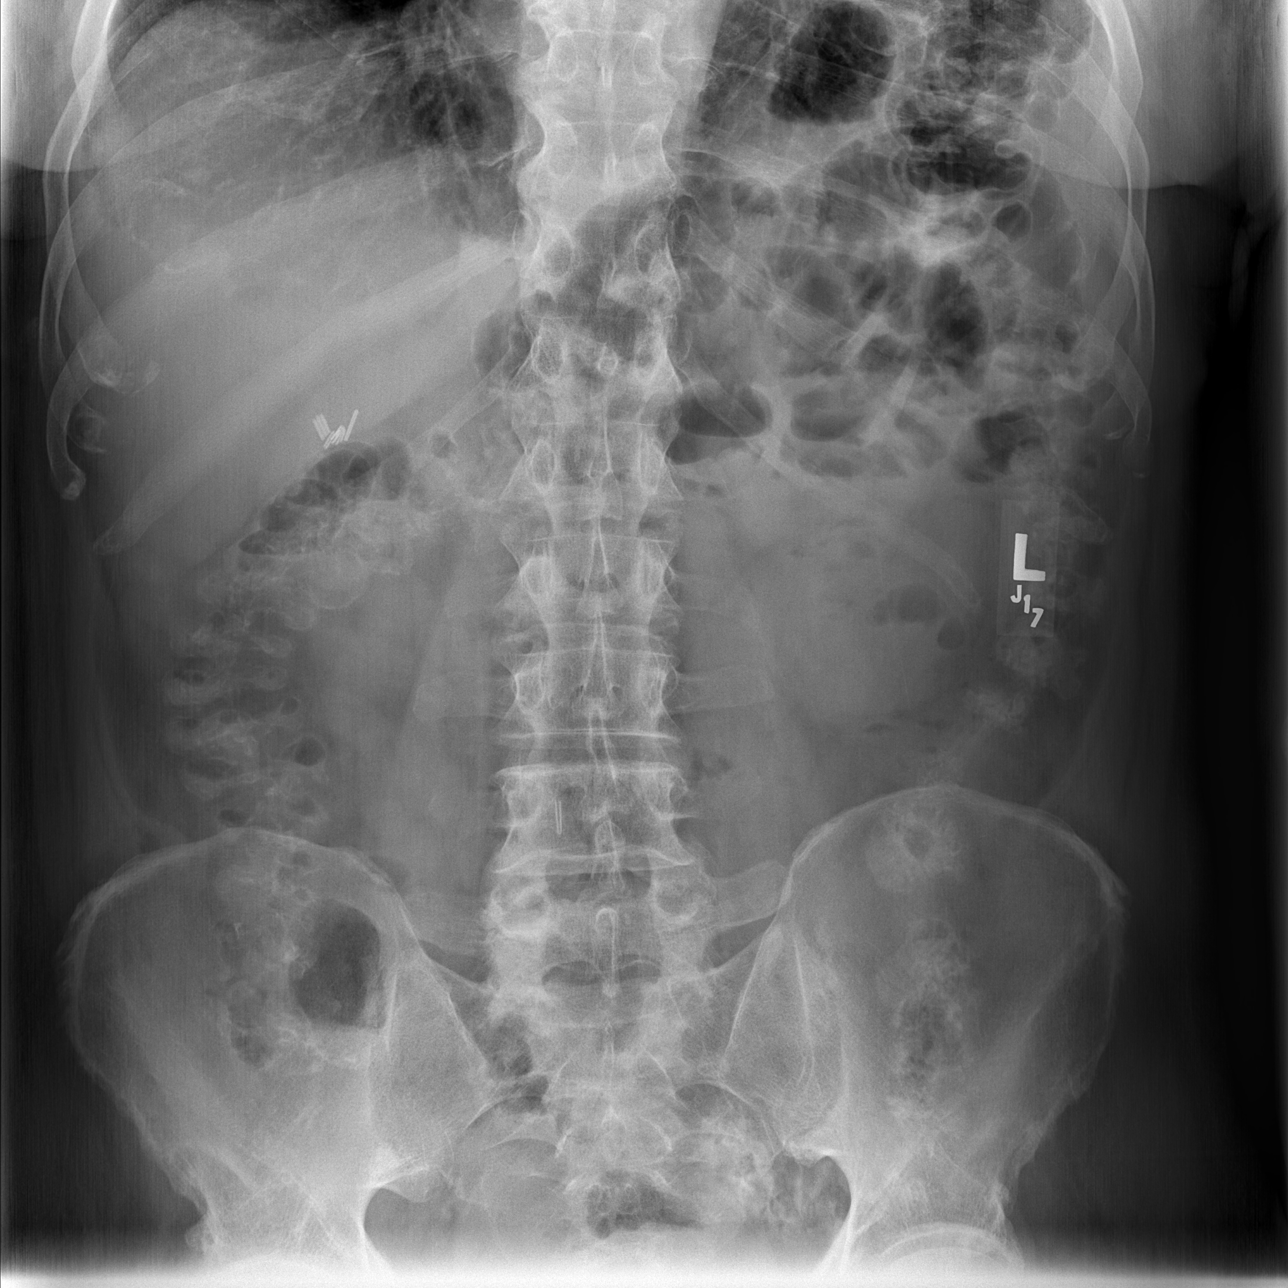

[w abdomen upright (2 of 2)]
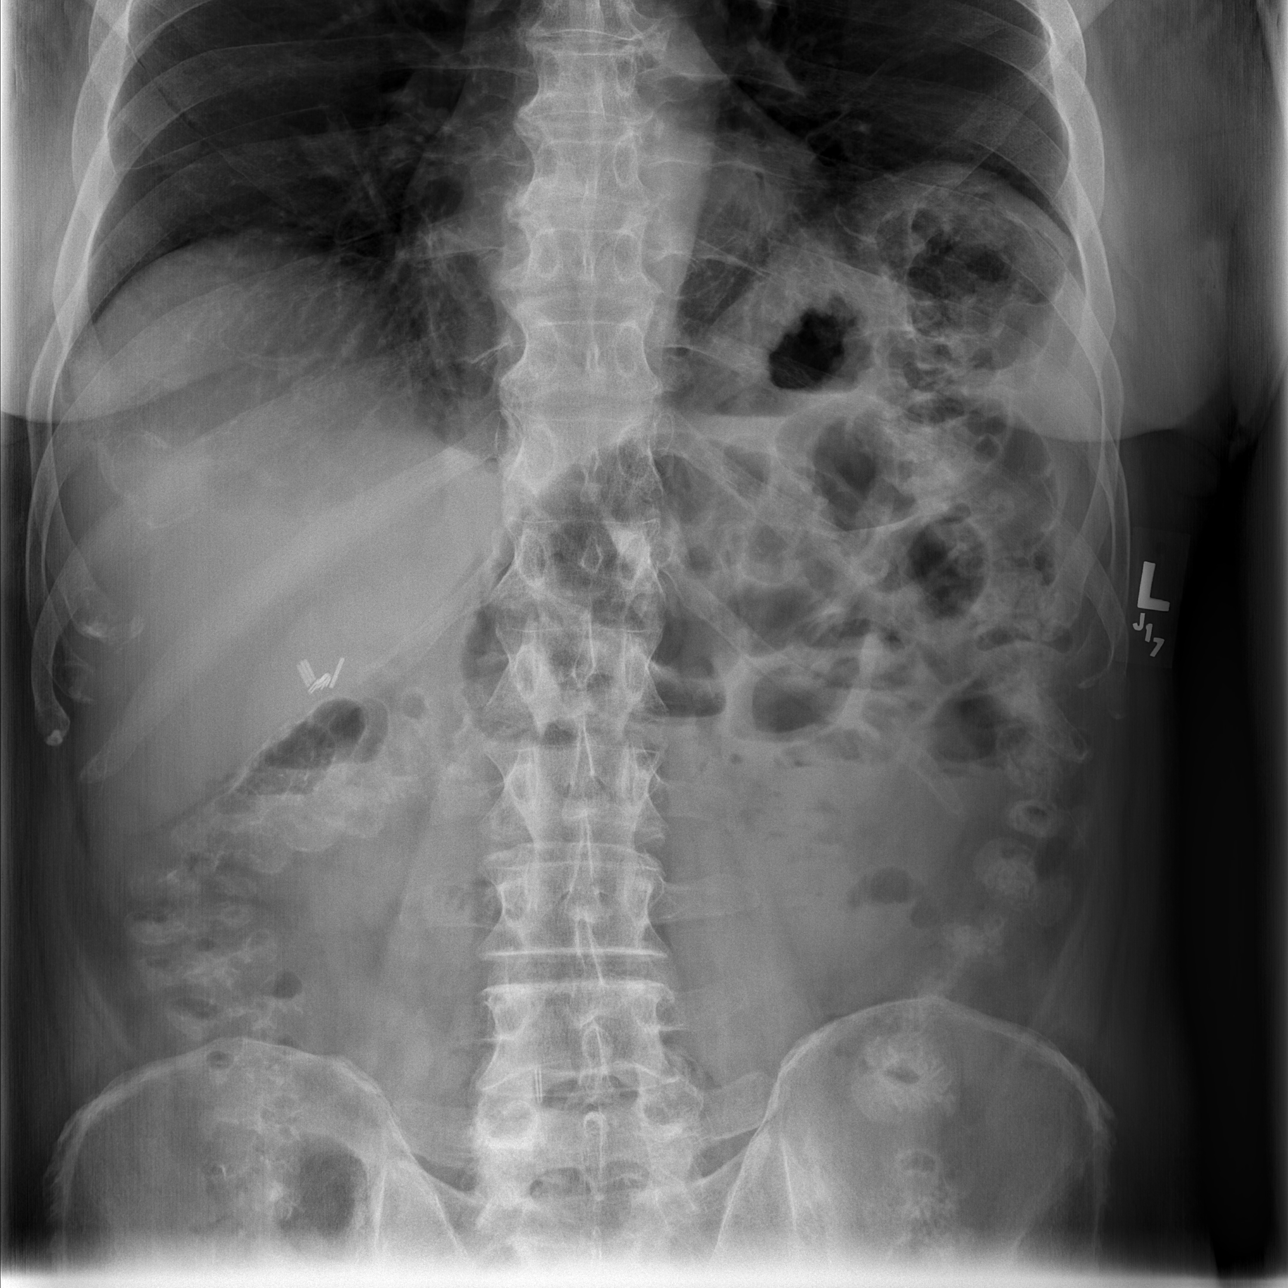

[t abdomen supine]
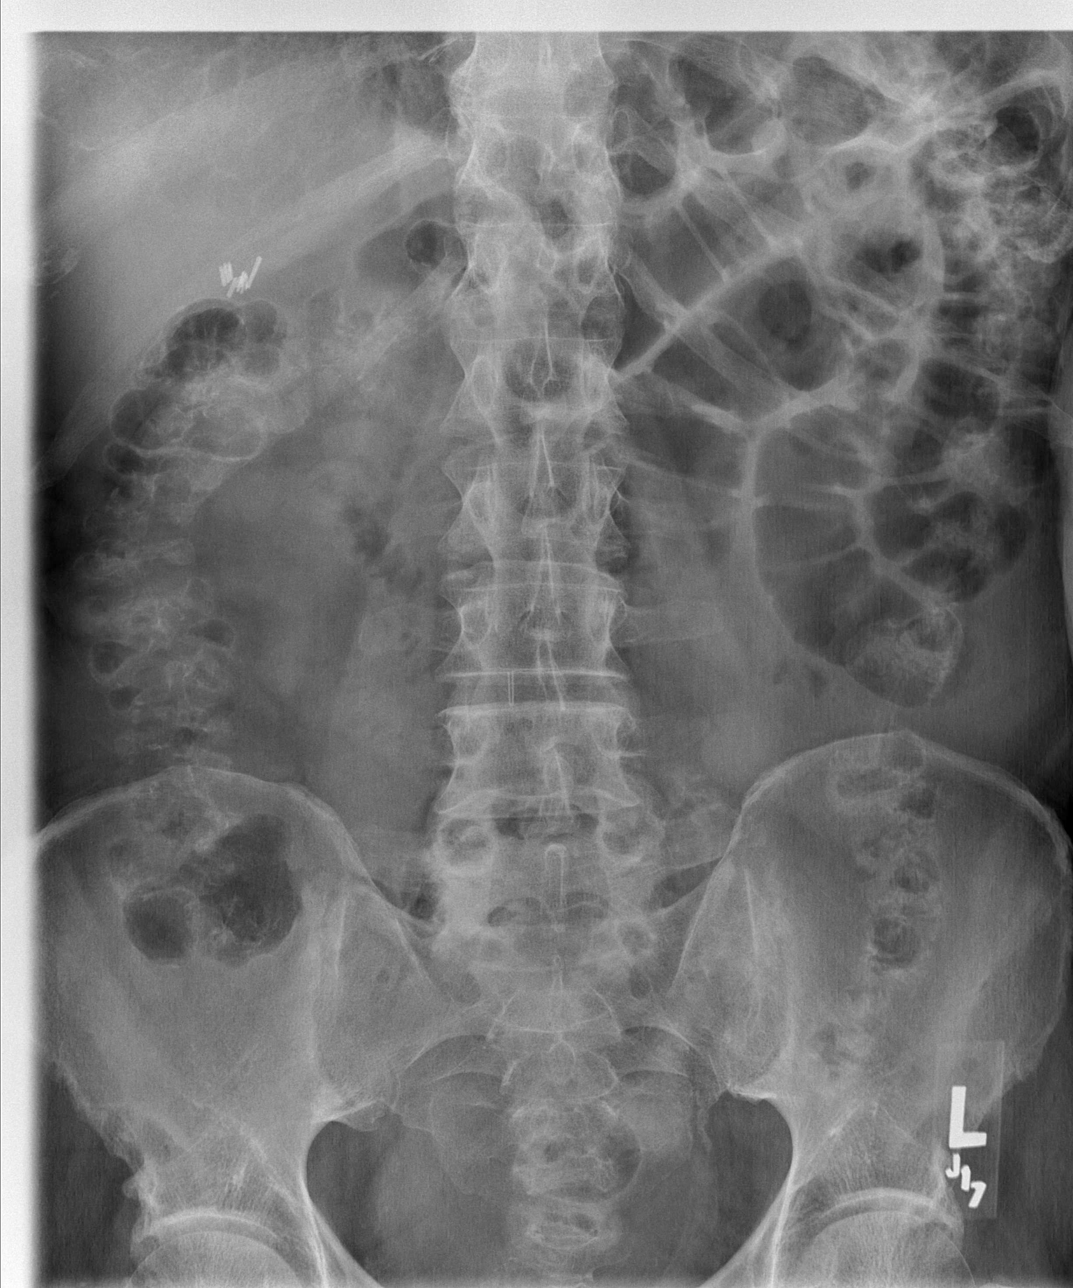

[3 of 3 positions shown; findings below may reference images not displayed]

FINDINGS: The nasogastric tube has been removed since previous
study.  There are persistent mildly dilated small bowel loops in
the left upper quadrant which contain air fluid levels.  Air fluid
levels also seen scattered to the colon, with decreased residual
contrast throughout the colon compared to prior exam.  These
findings remain suspicious for a partial mid small bowel
obstruction.

No evidence of free air.  Surgical clips again noted from prior
cholecystectomy.
IMPRESSION: Persistent partial mid small bowel obstruction, without significant
change.  Nasogastric tube has been removed since previous study.

## 2013-08-29 ENCOUNTER — Ambulatory Visit: Payer: Self-pay | Admitting: Podiatry

## 2013-09-04 ENCOUNTER — Ambulatory Visit (INDEPENDENT_AMBULATORY_CARE_PROVIDER_SITE_OTHER): Payer: Medicare PPO | Admitting: Podiatry

## 2013-09-04 ENCOUNTER — Encounter: Payer: Self-pay | Admitting: Podiatry

## 2013-09-04 VITALS — BP 169/87 | HR 70 | Resp 12

## 2013-09-04 DIAGNOSIS — M79609 Pain in unspecified limb: Secondary | ICD-10-CM

## 2013-09-04 DIAGNOSIS — B351 Tinea unguium: Secondary | ICD-10-CM

## 2013-09-05 ENCOUNTER — Encounter: Payer: Self-pay | Admitting: Podiatry

## 2013-09-05 NOTE — Progress Notes (Signed)
Patient ID: Walter Benson, male   DOB: [birth year], 80 y.o.   MRN: [id number]  Subjective: This patient of DR. Regal presents for ongoing debridement of painful mycotic toenails. He is a known diabetic and his last visit for this similar service was 01/17/2013.  Objective: Elongated, discolored, incurvated, hypertrophic toenails x10  Assessment: Symptomatic onychomycoses x10  Plan: Nails x10 debrided back. Reappoint intervals recommended in 4 months

## 2013-12-25 ENCOUNTER — Emergency Department (HOSPITAL_COMMUNITY)
Admission: EM | Admit: 2013-12-25 | Discharge: 2013-12-25 | Disposition: A | Discharge status: Home / Self Care | Payer: Medicare PPO | Source: Home / Self Care

## 2013-12-25 ENCOUNTER — Encounter (HOSPITAL_COMMUNITY): Payer: Self-pay | Admitting: Emergency Medicine

## 2013-12-25 DIAGNOSIS — W57XXXA Bitten or stung by nonvenomous insect and other nonvenomous arthropods, initial encounter: Principal | ICD-10-CM

## 2013-12-25 DIAGNOSIS — S30861A Insect bite (nonvenomous) of abdominal wall, initial encounter: Secondary | ICD-10-CM

## 2013-12-25 DIAGNOSIS — S30860A Insect bite (nonvenomous) of lower back and pelvis, initial encounter: Secondary | ICD-10-CM

## 2013-12-25 NOTE — ED Provider Notes (Signed)
CSN: [id number]     Arrival date & time 12/25/13  1314 History   First MD Initiated Contact with Patient 12/25/13 1326     Chief Complaint  Patient presents with  . Tick Removal   (Consider location/radiation/quality/duration/timing/severity/associated sxs/prior Treatment) HPI Comments: 80 year old male noticed something attached to the right waist this morning. He did not quite tell this was a mole or possibly a tick. He does not recall feeling this yesterday. He denies local pain, itching or burning. He does not feel he will in any way.  With magnification it is obvious that this is a tick.   History reviewed. No pertinent past medical history. History reviewed. No pertinent past surgical history. History reviewed. No pertinent family history. History  Substance Use Topics  . Smoking status: Former Games developer  . Smokeless tobacco: Not on file  . Alcohol Use: No    Review of Systems  Skin: Negative for color change, pallor, rash and wound.  All other systems reviewed and are negative.   Allergies  Review of patient's allergies indicates no known allergies.  Home Medications   Prior to Admission medications   Medication Sig Start Date End Date Taking? Authorizing Provider  metFORMIN (GLUCOPHAGE) 500 MG tablet Take by mouth 2 (two) times daily with a meal.   Yes Historical Provider, MD  BOOSTRIX 5-2.5-18.5 injection  08/06/13   Historical Provider, MD  folic acid (FOLVITE) 1 MG tablet  06/25/13   Historical Provider, MD  glipiZIDE (GLUCOTROL) 5 MG tablet  06/11/13   Historical Provider, MD  hydroxychloroquine (PLAQUENIL) 200 MG tablet  08/07/13   Historical Provider, MD  methotrexate (RHEUMATREX) 2.5 MG tablet  08/22/13   Historical Provider, MD  Letta Pate VERIO test strip  08/08/13   Historical Provider, MD  predniSONE (DELTASONE) 5 MG tablet  08/27/13   Historical Provider, MD  tobramycin-dexamethasone Wallene Dales) ophthalmic solution  05/31/13   Historical Provider, MD   BP 189/74   Pulse 78  Temp(Src) 98.6 F (37 C) (Oral)  Resp 18  SpO2 99% Physical Exam  Nursing note and vitals reviewed. Constitutional: He is oriented to person, place, and time. He appears well-developed and well-nourished. No distress.  Pulmonary/Chest: Effort normal.  Neurological: He is alert and oriented to person, place, and time.  Skin: Skin is warm and dry. No rash noted. No erythema. No pallor.  Psychiatric: He has a normal mood and affect.    ED Course  Procedures (including critical care time) Labs Review Labs Reviewed - No data to display  Imaging Review No results found.   MDM   1. Tick bite of abdominal wall     Tick removed with gloved finger. Under magnification the mandible/head remains intact.  Tick bite info given. Watch for infection,.  The type of tick appears to be a brown male dog tick. It is completely flat with no signs of engorgement.   Hayden Rasmussen, NP 12/25/13 1400

## 2013-12-25 NOTE — Discharge Instructions (Signed)
Tick Bite Information  Be sure to read these instructions and signs of illness that may be related to tick fever below. If you get sick in any way, fever or symptoms as below seek medical attention promptly. Ticks are insects that attach themselves to the skin and draw blood for food. There are various types of ticks. Common types include wood ticks and deer ticks. Most ticks live in shrubs and grassy areas. Ticks can climb onto your body when you make contact with leaves or grass where the tick is waiting. The most common places on the body for ticks to attach themselves are the scalp, neck, armpits, waist, and groin. Most tick bites are harmless, but sometimes ticks carry germs that cause diseases. These germs can be spread to a person during the tick's feeding process. The chance of a disease spreading through a tick bite depends on:   The type of tick.  Time of year.   How long the tick is attached.   Geographic location.  HOW CAN YOU PREVENT TICK BITES? Take these steps to help prevent tick bites when you are outdoors:  Wear protective clothing. Long sleeves and long pants are best.   Wear white clothes so you can see ticks more easily.  Tuck your pant legs into your socks.   If walking on a trail, stay in the middle of the trail to avoid brushing against bushes.  Avoid walking through areas with long grass.  Put insect repellent on all exposed skin and along boot tops, pant legs, and sleeve cuffs.   Check clothing, hair, and skin repeatedly and before going inside.   Brush off any ticks that are not attached.  Take a shower or bath as soon as possible after being outdoors.  WHAT IS THE PROPER WAY TO REMOVE A TICK? Ticks should be removed as soon as possible to help prevent diseases caused by tick bites. 1. If latex gloves are available, put them on before trying to remove a tick.  2. Using fine-point tweezers, grasp the tick as close to the skin as possible. You  may also use curved forceps or a tick removal tool. Grasp the tick as close to its head as possible. Avoid grasping the tick on its body. 3. Pull gently with steady upward pressure until the tick lets go. Do not twist the tick or jerk it suddenly. This may break off the tick's head or mouth parts. 4. Do not squeeze or crush the tick's body. This could force disease-carrying fluids from the tick into your body.  5. After the tick is removed, wash the bite area and your hands with soap and water or other disinfectant such as alcohol. 6. Apply a small amount of antiseptic cream or ointment to the bite site.  7. Wash and disinfect any instruments that were used.  Do not try to remove a tick by applying a hot match, petroleum jelly, or fingernail polish to the tick. These methods do not work and may increase the chances of disease being spread from the tick bite.  WHEN SHOULD YOU SEEK MEDICAL CARE? Contact your health care provider if you are unable to remove a tick from your skin or if a part of the tick breaks off and is stuck in the skin.  After a tick bite, you need to be aware of signs and symptoms that could be related to diseases spread by ticks. Contact your health care provider if you develop any of the following in the  days or weeks after the tick bite:  Unexplained fever.  Rash. A circular rash that appears days or weeks after the tick bite may indicate the possibility of Lyme disease. The rash may resemble a target with a bull's-eye and may occur at a different part of your body than the tick bite.  Redness and swelling in the area of the tick bite.   Tender, swollen lymph glands.   Diarrhea.   Weight loss.   Cough.   Fatigue.   Muscle, joint, or bone pain.   Abdominal pain.   Headache.   Lethargy or a change in your level of consciousness.  Difficulty walking or moving your legs.   Numbness in the legs.   Paralysis.  Shortness of breath.   Confusion.    Repeated vomiting.  Document Released: 07/08/2000 Document Revised: 05/01/2013 Document Reviewed: 12/19/2012 [facility] Patient Information 2014 [place], [place].

## 2013-12-25 NOTE — ED Notes (Signed)
Presents w attached insect (legs visible)

## 2013-12-26 NOTE — ED Provider Notes (Signed)
Medical screening examination/treatment/procedure(s) were performed by non-physician practitioner and as supervising physician I was immediately available for consultation/collaboration.  Leslee Home, M.D.   Reuben Likes, MD 12/26/13 [phone]

## 2013-12-30 ENCOUNTER — Encounter: Payer: Self-pay | Admitting: Podiatry

## 2013-12-30 ENCOUNTER — Ambulatory Visit (INDEPENDENT_AMBULATORY_CARE_PROVIDER_SITE_OTHER): Payer: Medicare PPO | Admitting: Podiatry

## 2013-12-30 VITALS — BP 130/78 | HR 87 | Resp 16

## 2013-12-30 DIAGNOSIS — B351 Tinea unguium: Secondary | ICD-10-CM

## 2013-12-30 DIAGNOSIS — M79609 Pain in unspecified limb: Secondary | ICD-10-CM

## 2013-12-30 NOTE — Progress Notes (Signed)
   Subjective:    Patient ID: Walter Benson, male    DOB: 09-19-1932, 80 y.o.   MRN: [id number]  HPI Nail trim and wants another pair of diabetic shoes    Review of Systems     Objective:   Physical Exam        Assessment & Plan:

## 2013-12-31 NOTE — Progress Notes (Signed)
Subjective:     Patient ID: Walter Benson, male   DOB: 27-Feb-1933, 80 y.o.   MRN: [id number]  HPI patient presents stating I have  nails that need to be cut on both feet that are painful and thick   Review of Systems     Objective:   Physical Exam Neurovascular status is found to be intact but diminished with thick painful nail bed 1-5 both feet and at risk diabetes with structural deformity and neurological loss both sharp dull and vibratory    Assessment:     At risk diabetic with deformity and nail disease    Plan:     Debridement painful nailbeds and we will get him authorized for a new pair diabetic shoes as is other pair have worn out

## 2014-02-07 ENCOUNTER — Ambulatory Visit (INDEPENDENT_AMBULATORY_CARE_PROVIDER_SITE_OTHER): Payer: Medicare PPO | Admitting: Podiatry

## 2014-02-07 DIAGNOSIS — B351 Tinea unguium: Secondary | ICD-10-CM

## 2014-02-07 NOTE — Progress Notes (Signed)
   Subjective:    Patient ID: Walter Benson, male    DOB: 11-12-1932, 80 y.o.   MRN: [id number]  HPI Comments: Pt presents for diabetic shoe measurement and molding.  Pt states he would like the same style and size as he has on at this time, except in brown.  Pt is wearing Ariya 13 wide in black.  I molded pt with a foam box to get the same style custom molded diabetic inserts.  I will order Ariya 13 wide brown moc toe Y910.  Diabetes      Review of Systems     Objective:   Physical Exam        Assessment & Plan:

## 2014-03-20 ENCOUNTER — Encounter: Payer: Self-pay | Admitting: Podiatry

## 2014-03-20 ENCOUNTER — Ambulatory Visit (INDEPENDENT_AMBULATORY_CARE_PROVIDER_SITE_OTHER): Payer: Medicare PPO | Admitting: Podiatry

## 2014-03-20 VITALS — BP 154/72 | HR 79 | Resp 16 | Ht 73.5 in | Wt 190.0 lb

## 2014-03-20 DIAGNOSIS — M2040 Other hammer toe(s) (acquired), unspecified foot: Secondary | ICD-10-CM | POA: Diagnosis not present

## 2014-03-20 DIAGNOSIS — E1159 Type 2 diabetes mellitus with other circulatory complications: Secondary | ICD-10-CM

## 2014-03-20 DIAGNOSIS — M79673 Pain in unspecified foot: Secondary | ICD-10-CM

## 2014-03-20 DIAGNOSIS — E1149 Type 2 diabetes mellitus with other diabetic neurological complication: Secondary | ICD-10-CM

## 2014-03-20 DIAGNOSIS — L84 Corns and callosities: Secondary | ICD-10-CM

## 2014-03-20 DIAGNOSIS — M79609 Pain in unspecified limb: Secondary | ICD-10-CM

## 2014-03-20 NOTE — Patient Instructions (Signed)

## 2014-03-20 NOTE — Progress Notes (Signed)
Subjective:     Patient ID: Walter Benson, male   DOB: Jan 18, 1933, 80 y.o.   MRN: [id number]  HPI patient presents stating the diabetic shoes fit well and my feet have been doing pretty good but I know I need my nails cut   Review of Systems     Objective:   Physical Exam Neurovascular status unchanged with diminishment of sharp Dole vibratory and diminished circulatory status with thick nail disease and digital deformities noted    Assessment:     At risk diabetic with deformity    Plan:     Reviewed condition and recommended breaking in the diabetic shoes at this time and it fit well and also gave instructions for continued nail care in the future

## 2014-03-20 NOTE — Progress Notes (Signed)
   Subjective:    Patient ID: Walter Benson, male    DOB: 06-Apr-1933, 80 y.o.   MRN: [id number]  HPI Comments: Pt states he likes his new diabetic shoes Y910MW 13 W and the custom molded inserts.  Oral and printed wearing instructions were given.  Diabetes      Review of Systems     Objective:   Physical Exam        Assessment & Plan:

## 2014-04-14 ENCOUNTER — Other Ambulatory Visit: Payer: Medicare PPO

## 2014-04-17 ENCOUNTER — Other Ambulatory Visit: Payer: Medicare PPO

## 2014-05-05 ENCOUNTER — Other Ambulatory Visit: Payer: Medicare PPO

## 2014-06-30 ENCOUNTER — Ambulatory Visit (INDEPENDENT_AMBULATORY_CARE_PROVIDER_SITE_OTHER): Payer: Medicare PPO | Admitting: Podiatry

## 2014-06-30 DIAGNOSIS — M779 Enthesopathy, unspecified: Secondary | ICD-10-CM

## 2014-06-30 DIAGNOSIS — M79673 Pain in unspecified foot: Secondary | ICD-10-CM

## 2014-06-30 DIAGNOSIS — B351 Tinea unguium: Secondary | ICD-10-CM

## 2014-06-30 NOTE — Progress Notes (Signed)
Subjective:     Patient ID: Walter Benson, male   DOB: 1932-08-16, 80 y.o.   MRN: [id number]  HPI patient presents stating the diabetic shoes fit well and my feet have been doing pretty good but I know I need my nails cut. Also states that he is getting pain on top of his right foot   Review of Systems     Objective:   Physical Exam Neurovascular status unchanged with diminishment of sharp Dole vibratory and diminished circulatory status with thick nail disease and digital deformities noted. Patient is noted to have tendinitis of the dorsum of the right foot and medial side of the right foot with moderate depression of the arch    Assessment:     At risk diabetic with deformity and nail disease 1-5 both feet that are painful. Patient is noted to have pain in the right foot and ankle and is wearing his diabetic shoes    Plan:     Reviewed condition and recommended breaking in the diabetic shoes at this time and it fit well and also gave instructions for continued nail care in the future. Debridement of nailbeds 1-5 of both feet was accomplished with no iatrogenic bleeding noted and we discussed tendinitis and utilizing physical therapy and diabetic shoes at this time for treatment with considerations for injections in the future

## 2014-09-29 ENCOUNTER — Ambulatory Visit: Payer: Medicare PPO

## 2014-11-24 ENCOUNTER — Ambulatory Visit: Payer: Medicare PPO

## 2014-12-02 ENCOUNTER — Ambulatory Visit: Payer: Medicare PPO

## 2014-12-09 ENCOUNTER — Ambulatory Visit (INDEPENDENT_AMBULATORY_CARE_PROVIDER_SITE_OTHER): Payer: Medicare PPO | Admitting: Podiatry

## 2014-12-09 DIAGNOSIS — B351 Tinea unguium: Secondary | ICD-10-CM

## 2014-12-09 DIAGNOSIS — M79673 Pain in unspecified foot: Secondary | ICD-10-CM | POA: Diagnosis not present

## 2014-12-09 NOTE — Progress Notes (Signed)
   Subjective:    Patient ID: Walter Benson, male    DOB: Jul 20, 1933, 81 y.o.   MRN: [id number]  HPI   "Patient is here today for a B/L toenail trim, is being seen by Dr Stacie AcresMayer."  Review of Systems  All other systems reviewed and are negative. HPI Presents today chief complaint of painful elongated toenails.  Objective: Pulses are palpable bilateral nails are thick, yellow dystrophic onychomycosis and painful palpation.   Assessment: Onychomycosis with pain in limb.  Plan: Treatment of nails in thickness and length as covered service secondary to pain.      Objective:   Physical Exam        Assessment & Plan:

## 2015-03-12 DIAGNOSIS — Z79899 Other long term (current) drug therapy: Secondary | ICD-10-CM | POA: Diagnosis not present

## 2015-03-12 DIAGNOSIS — H15043 Scleritis with corneal involvement, bilateral: Secondary | ICD-10-CM | POA: Diagnosis not present

## 2015-03-12 DIAGNOSIS — M0589 Other rheumatoid arthritis with rheumatoid factor of multiple sites: Secondary | ICD-10-CM | POA: Diagnosis not present

## 2015-04-14 ENCOUNTER — Ambulatory Visit (INDEPENDENT_AMBULATORY_CARE_PROVIDER_SITE_OTHER): Payer: Medicare PPO | Admitting: Podiatry

## 2015-04-14 ENCOUNTER — Encounter: Payer: Self-pay | Admitting: Podiatry

## 2015-04-14 DIAGNOSIS — M79673 Pain in unspecified foot: Secondary | ICD-10-CM

## 2015-04-14 DIAGNOSIS — B351 Tinea unguium: Secondary | ICD-10-CM

## 2015-04-14 DIAGNOSIS — Z6824 Body mass index (BMI) 24.0-24.9, adult: Secondary | ICD-10-CM | POA: Diagnosis not present

## 2015-04-14 DIAGNOSIS — K219 Gastro-esophageal reflux disease without esophagitis: Secondary | ICD-10-CM | POA: Diagnosis not present

## 2015-04-14 DIAGNOSIS — E119 Type 2 diabetes mellitus without complications: Secondary | ICD-10-CM | POA: Diagnosis not present

## 2015-04-14 DIAGNOSIS — M069 Rheumatoid arthritis, unspecified: Secondary | ICD-10-CM | POA: Diagnosis not present

## 2015-04-14 NOTE — Progress Notes (Signed)
Patient ID: Walter Benson, male   DOB: 1933-01-08, 81 y.o.   MRN: [id number] Complaint:  Visit Type: Patient returns to my office for continued preventative foot care services. Complaint: Patient states" my nails have grown long and thick and become painful to walk and wear shoes" Patient has been diagnosed with DM with no foot complications. The patient presents for preventative foot care services. No changes to ROS  Podiatric Exam: Vascular: dorsalis pedis and posterior tibial pulses are palpable bilateral. Capillary return is immediate. Temperature gradient is WNL. Skin turgor WNL  Sensorium: Diminished  Semmes Weinstein monofilament test. Normal tactile sensation bilaterally. Nail Exam: Pt has thick disfigured discolored nails with subungual debris noted bilateral entire nail hallux through fifth toenails Ulcer Exam: There is no evidence of ulcer or pre-ulcerative changes or infection. Orthopedic Exam: Muscle tone and strength are WNL. No limitations in general ROM. No crepitus or effusions noted. Foot type and digits show no abnormalities. 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 Skin: No Porokeratosis. No infection or ulcers  Diagnosis:  Onychomycosis, , Pain in right toe, pain in left toes  Treatment & Plan Procedures and Treatment: Consent by patient was obtained for treatment procedures. The patient understood the discussion of treatment and procedures well. All questions were answered thoroughly reviewed. Debridement of mycotic and hypertrophic toenails, 1 through 5 bilateral and clearing of subungual debris. No ulceration, no infection noted.  Return Visit-Office Procedure: Patient instructed to return to the office for a follow up visit 3 months for continued evaluation and treatment.

## 2015-04-16 DIAGNOSIS — E139 Other specified diabetes mellitus without complications: Secondary | ICD-10-CM | POA: Diagnosis not present

## 2015-04-16 DIAGNOSIS — E782 Mixed hyperlipidemia: Secondary | ICD-10-CM | POA: Diagnosis not present

## 2015-04-16 DIAGNOSIS — Z23 Encounter for immunization: Secondary | ICD-10-CM | POA: Diagnosis not present

## 2015-04-21 DIAGNOSIS — E119 Type 2 diabetes mellitus without complications: Secondary | ICD-10-CM | POA: Diagnosis not present

## 2015-04-21 DIAGNOSIS — H2513 Age-related nuclear cataract, bilateral: Secondary | ICD-10-CM | POA: Diagnosis not present

## 2015-04-21 DIAGNOSIS — M069 Rheumatoid arthritis, unspecified: Secondary | ICD-10-CM | POA: Diagnosis not present

## 2015-06-11 DIAGNOSIS — M0589 Other rheumatoid arthritis with rheumatoid factor of multiple sites: Secondary | ICD-10-CM | POA: Diagnosis not present

## 2015-06-11 DIAGNOSIS — H15043 Scleritis with corneal involvement, bilateral: Secondary | ICD-10-CM | POA: Diagnosis not present

## 2015-06-11 DIAGNOSIS — Z79899 Other long term (current) drug therapy: Secondary | ICD-10-CM | POA: Diagnosis not present

## 2015-07-06 DIAGNOSIS — M0589 Other rheumatoid arthritis with rheumatoid factor of multiple sites: Secondary | ICD-10-CM | POA: Diagnosis not present

## 2015-08-11 ENCOUNTER — Encounter: Payer: Self-pay | Admitting: Podiatry

## 2015-08-11 ENCOUNTER — Ambulatory Visit (INDEPENDENT_AMBULATORY_CARE_PROVIDER_SITE_OTHER): Payer: Medicare Other | Admitting: Podiatry

## 2015-08-11 DIAGNOSIS — M79671 Pain in right foot: Secondary | ICD-10-CM

## 2015-08-11 DIAGNOSIS — B351 Tinea unguium: Secondary | ICD-10-CM

## 2015-08-11 DIAGNOSIS — M79672 Pain in left foot: Secondary | ICD-10-CM | POA: Diagnosis not present

## 2015-08-11 DIAGNOSIS — M79673 Pain in unspecified foot: Secondary | ICD-10-CM

## 2015-08-11 NOTE — Progress Notes (Signed)
Patient ID: Walter Benson, male   DOB: 08/04/1932, 82 y.o.   MRN: [id number] Complaint:  Visit Type: Patient returns to my office for continued preventative foot care services. Complaint: Patient states" my nails have grown long and thick and become painful to walk and wear shoes" Patient has been diagnosed with DM with no foot complications. The patient presents for preventative foot care services. No changes to ROS  Podiatric Exam: Vascular: dorsalis pedis and posterior tibial pulses are palpable bilateral. Capillary return is immediate. Temperature gradient is WNL. Skin turgor WNL  Sensorium: Diminished  Semmes Weinstein monofilament test. Normal tactile sensation bilaterally. Nail Exam: Pt has thick disfigured discolored nails with subungual debris noted bilateral entire nail hallux through fifth toenails Ulcer Exam: There is no evidence of ulcer or pre-ulcerative changes or infection. Orthopedic Exam: Muscle tone and strength are WNL. No limitations in general ROM. No crepitus or effusions noted. Foot type and digits show no abnormalities. 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 Skin: No Porokeratosis. No infection or ulcers  Diagnosis:  Onychomycosis, , Pain in right toe, pain in left toes  Treatment & Plan Procedures and Treatment: Consent by patient was obtained for treatment procedures. The patient understood the discussion of treatment and procedures well. All questions were answered thoroughly reviewed. Debridement of mycotic and hypertrophic toenails, 1 through 5 bilateral and clearing of subungual debris. No ulceration, no infection noted.  Return Visit-Office Procedure: Patient instructed to return to the office for a follow up visit 3 months for continued evaluation and treatment.   Helane Gunther DPM

## 2015-11-10 ENCOUNTER — Encounter: Payer: Self-pay | Admitting: Podiatry

## 2015-11-10 ENCOUNTER — Ambulatory Visit (INDEPENDENT_AMBULATORY_CARE_PROVIDER_SITE_OTHER): Payer: Medicare Other | Admitting: Podiatry

## 2015-11-10 DIAGNOSIS — E119 Type 2 diabetes mellitus without complications: Secondary | ICD-10-CM | POA: Diagnosis not present

## 2015-11-10 DIAGNOSIS — B351 Tinea unguium: Secondary | ICD-10-CM | POA: Diagnosis not present

## 2015-11-10 DIAGNOSIS — M79673 Pain in unspecified foot: Secondary | ICD-10-CM

## 2015-11-10 NOTE — Progress Notes (Signed)
Patient ID: Walter Benson, male   DOB: Dec 25, 1932, 82 y.o.   MRN: [id number] Complaint:  Visit Type: Patient returns to my office for continued preventative foot care services. Complaint: Patient states" my nails have grown long and thick and become painful to walk and wear shoes" Patient has been diagnosed with DM with no foot complications. The patient presents for preventative foot care services. No changes to ROS.  Hg1AC is 6.4  Podiatric Exam: Vascular: dorsalis pedis and posterior tibial pulses are palpable bilateral. Capillary return is immediate. Temperature gradient is WNL. Skin turgor WNL  Sensorium: Diminished  Semmes Weinstein monofilament test. Normal tactile sensation bilaterally. Nail Exam: Pt has thick disfigured discolored nails with subungual debris noted bilateral entire nail hallux through fifth toenails Ulcer Exam: There is no evidence of ulcer or pre-ulcerative changes or infection. Orthopedic Exam: Muscle tone and strength are WNL. No limitations in general ROM. No crepitus or effusions noted. Foot type and digits show no abnormalities. 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 No infection or ulcers  Diagnosis:  Onychomycosis, , Pain in right toe, pain in left toes  Treatment & Plan Procedures and Treatment: Consent by patient was obtained for treatment procedures. The patient understood the discussion of treatment and procedures well. All questions were answered thoroughly reviewed. Debridement of mycotic and hypertrophic toenails, 1 through 5 bilateral and clearing of subungual debris. No ulceration, no infection noted.  Return Visit-Office Procedure: Patient instructed to return to the office for a follow up visit 3 months for continued evaluation and treatment.   Walter Benson DPM

## 2016-02-09 ENCOUNTER — Ambulatory Visit: Payer: Medicare Other | Admitting: Podiatry

## 2016-02-26 ENCOUNTER — Ambulatory Visit: Payer: Medicare Other | Admitting: Podiatry

## 2016-03-04 ENCOUNTER — Ambulatory Visit (INDEPENDENT_AMBULATORY_CARE_PROVIDER_SITE_OTHER): Payer: Medicare Other | Admitting: Podiatry

## 2016-03-04 ENCOUNTER — Encounter: Payer: Self-pay | Admitting: Podiatry

## 2016-03-04 DIAGNOSIS — M79673 Pain in unspecified foot: Secondary | ICD-10-CM

## 2016-03-04 DIAGNOSIS — B351 Tinea unguium: Secondary | ICD-10-CM

## 2016-03-04 DIAGNOSIS — E119 Type 2 diabetes mellitus without complications: Secondary | ICD-10-CM

## 2016-03-04 NOTE — Progress Notes (Signed)
Patient ID: Walter Benson, male   DOB: Dec 15, 1932, 82 y.o.   MRN: [id number] Complaint:  Visit Type: Patient returns to my office for continued preventative foot care services. Complaint: Patient states" my nails have grown long and thick and become painful to walk and wear shoes" Patient has been diagnosed with DM with no foot complications. The patient presents for preventative foot care services. No changes to ROS.  Hg1AC is 7.0  Podiatric Exam: Vascular: dorsalis pedis and posterior tibial pulses are palpable bilateral. Capillary return is immediate. Temperature gradient is WNL. Skin turgor WNL  Sensorium: Diminished  Semmes Weinstein monofilament test. Normal tactile sensation bilaterally. Nail Exam: Pt has thick disfigured discolored nails with subungual debris noted bilateral entire nail hallux through fifth toenails Ulcer Exam: There is no evidence of ulcer or pre-ulcerative changes or infection. Orthopedic Exam: Muscle tone and strength are WNL. No limitations in general ROM. No crepitus or effusions noted. Foot type and digits show no abnormalities. 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 No infection or ulcers  Diagnosis:  Onychomycosis, , Pain in right toe, pain in left toes  Treatment & Plan Procedures and Treatment: Consent by patient was obtained for treatment procedures. The patient understood the discussion of treatment and procedures well. All questions were answered thoroughly reviewed. Debridement of mycotic and hypertrophic toenails, 1 through 5 bilateral and clearing of subungual debris. No ulceration, no infection noted.  Return Visit-Office Procedure: Patient instructed to return to the office for a follow up visit 3 months for continued evaluation and treatment.   Helane GuntherGregory Ofilia Rayon DPM

## 2016-06-03 ENCOUNTER — Ambulatory Visit (INDEPENDENT_AMBULATORY_CARE_PROVIDER_SITE_OTHER): Payer: Medicare Other | Admitting: Podiatry

## 2016-06-03 DIAGNOSIS — M79672 Pain in left foot: Secondary | ICD-10-CM

## 2016-06-03 DIAGNOSIS — B351 Tinea unguium: Secondary | ICD-10-CM | POA: Diagnosis not present

## 2016-06-03 DIAGNOSIS — E119 Type 2 diabetes mellitus without complications: Secondary | ICD-10-CM | POA: Diagnosis not present

## 2016-06-03 DIAGNOSIS — M79671 Pain in right foot: Secondary | ICD-10-CM

## 2016-06-03 NOTE — Progress Notes (Signed)
Patient ID: Walter Benson, male   DOB: 09/04/1932, 83 y.o.   MRN: [id number] Complaint:  Visit Type: Patient returns to my office for continued preventative foot care services. Complaint: Patient states" my nails have grown long and thick and become painful to walk and wear shoes" Patient has been diagnosed with DM with no foot complications. The patient presents for preventative foot care services. No changes to ROS.  Hg1AC is 7.0  Podiatric Exam: Vascular: dorsalis pedis and posterior tibial pulses are palpable bilateral. Capillary return is immediate. Temperature gradient is WNL. Skin turgor WNL  Sensorium: Diminished  Semmes Weinstein monofilament test. Normal tactile sensation bilaterally. Nail Exam: Pt has thick disfigured discolored nails with subungual debris noted bilateral entire nail hallux through fifth toenails Ulcer Exam: There is no evidence of ulcer or pre-ulcerative changes or infection. Orthopedic Exam: Muscle tone and strength are WNL. No limitations in general ROM. No crepitus or effusions noted. Foot type and digits show no abnormalities. 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 No infection or ulcers  Diagnosis:  Onychomycosis, , Pain in right toe, pain in left toes  Treatment & Plan Procedures and Treatment: Consent by patient was obtained for treatment procedures. The patient understood the discussion of treatment and procedures well. All questions were answered thoroughly reviewed. Debridement of mycotic and hypertrophic toenails, 1 through 5 bilateral and clearing of subungual debris. No ulceration, no infection noted.  Return Visit-Office Procedure: Patient instructed to return to the office for a follow up visit 3 months for continued evaluation and treatment.   Zoriah Pulice DPM 

## 2016-09-06 ENCOUNTER — Ambulatory Visit: Payer: Medicare Other | Admitting: Podiatry
